# Patient Record
Sex: Male | Born: 1975 | Race: Black or African American | Hispanic: No | Marital: Single | Smoking: Current every day smoker
Health system: Southern US, Community
[De-identification: ages and names within clinical notes are randomized; demographics above are authoritative.]

## PROBLEM LIST (undated history)

## (undated) HISTORY — PX: ANKLE SURGERY: SHX546

---

## 2012-07-10 ENCOUNTER — Encounter (HOSPITAL_COMMUNITY): Payer: Self-pay | Admitting: Emergency Medicine

## 2012-07-10 ENCOUNTER — Emergency Department (HOSPITAL_COMMUNITY)
Admission: EM | Admit: 2012-07-10 | Discharge: 2012-07-10 | Disposition: A | Discharge status: Home / Self Care | Payer: Medicare Other | Attending: Emergency Medicine | Admitting: Emergency Medicine

## 2012-07-10 DIAGNOSIS — Z79899 Other long term (current) drug therapy: Secondary | ICD-10-CM | POA: Insufficient documentation

## 2012-07-10 DIAGNOSIS — Z9889 Other specified postprocedural states: Secondary | ICD-10-CM | POA: Insufficient documentation

## 2012-07-10 DIAGNOSIS — M25579 Pain in unspecified ankle and joints of unspecified foot: Secondary | ICD-10-CM | POA: Insufficient documentation

## 2012-07-10 DIAGNOSIS — F17200 Nicotine dependence, unspecified, uncomplicated: Secondary | ICD-10-CM | POA: Insufficient documentation

## 2012-07-10 DIAGNOSIS — Z760 Encounter for issue of repeat prescription: Secondary | ICD-10-CM | POA: Insufficient documentation

## 2012-07-10 DIAGNOSIS — R1110 Vomiting, unspecified: Secondary | ICD-10-CM | POA: Insufficient documentation

## 2012-07-10 DIAGNOSIS — Z8781 Personal history of (healed) traumatic fracture: Secondary | ICD-10-CM | POA: Insufficient documentation

## 2012-07-10 MED ORDER — OXYCODONE-ACETAMINOPHEN 5-325 MG PO TABS: 1.0000 | ORAL_TABLET | Freq: Four times a day (QID) | ORAL | Status: DC | PRN

## 2012-07-10 MED ORDER — ONDANSETRON 4 MG PO TBDP
8.0000 mg | ORAL_TABLET | Freq: Once | ORAL | Status: AC
  Administered 2012-07-10: 8 mg via ORAL
  Filled 2012-07-10: qty 2

## 2012-07-10 MED ORDER — OXYCODONE-ACETAMINOPHEN 5-325 MG PO TABS
2.0000 | ORAL_TABLET | Freq: Once | ORAL | Status: AC
  Administered 2012-07-10: 2 via ORAL
  Filled 2012-07-10: qty 2

## 2012-07-10 NOTE — ED Notes (Signed)
Pt sts out of pain meds and here with withdrawal sx; pt sticking finger down throat in room to make himself throw up; pt sts had 2 broken ankles

## 2012-07-10 NOTE — ED Provider Notes (Signed)
Medical screening examination/treatment/procedure(s) were performed by non-physician practitioner and as supervising physician I was immediately available for consultation/collaboration.   Glynn Octave, MD 07/10/12 1436

## 2012-07-10 NOTE — ED Provider Notes (Signed)
History     CSN: [id number]  Arrival date & time 07/10/12  1119   First MD Initiated Contact with Patient 07/10/12 1145      Chief Complaint  Patient presents with  . Ankle Pain  . Medication Refill  . Emesis    (Consider location/radiation/quality/duration/timing/severity/associated sxs/prior treatment) HPI Comments: 37 y/o male presents to the ED requesting a refill on his oxycodone and oxycontin for his bilateral broken ankles that were operated on back in February in [place]. States he has an appointment with his doctor on May 5 for reevaluation. He is in town for a family emergency and states he lost his pills about 3 days ago. Denies any new pain to his ankles. Feels as if he is going through withdrawal and has been vomiting a few times today. According to triage summary patient was sticking his finger down his throat in the room to make himself vomit.  Patient is a 37 y.o. male presenting with ankle pain and vomiting. The history is provided by the patient.  Ankle Pain Emesis   History reviewed. No pertinent past medical history.  History reviewed. No pertinent past surgical history.  History reviewed. No pertinent family history.  History  Substance Use Topics  . Smoking status: Current Every Day Smoker  . Smokeless tobacco: Not on file  . Alcohol Use: No      Review of Systems  Musculoskeletal:       Bilateral ankle pain.  All other systems reviewed and are negative.    Allergies  Review of patient's allergies indicates no known allergies.  Home Medications   Current Outpatient Rx  Name  Route  Sig  Dispense  Refill  . docusate sodium (COLACE) 100 MG capsule   Oral   Take 100 mg by mouth 2 (two) times daily as needed for constipation.         . enoxaparin (LOVENOX) 40 MG/0.4ML injection   Subcutaneous   Inject 40 mg into the skin daily.         Marland Kitchen oxyCODONE (ROXICODONE) 15 MG immediate release tablet   Oral   Take 15-30 mg by mouth  every 6 (six) hours as needed for pain.         . OxyCODONE HCl ER (OXYCONTIN) 30 MG T12A   Oral   Take 30 mg by mouth 2 (two) times daily.           BP 150/95  Pulse 76  Temp(Src) 97.4 F (36.3 C)  Resp 20  SpO2 99%  Physical Exam  Nursing note and vitals reviewed. Constitutional: He is oriented to person, place, and time. He appears well-developed and well-nourished. No distress.  HENT:  Head: Normocephalic and atraumatic.  Mouth/Throat: Oropharynx is clear and moist.  Eyes: Conjunctivae and EOM are normal. Pupils are equal, round, and reactive to light.  Neck: Normal range of motion. Neck supple.  Cardiovascular: Normal rate, regular rhythm, normal heart sounds and intact distal pulses.   Pulmonary/Chest: Effort normal and breath sounds normal. No respiratory distress.  Musculoskeletal:  4 inch surgical scar on bilateral anterior ankles. No swelling or deformity. Intact distal pulses. Generalized tenderness to palpation of bilateral ankles, ROM decreased limited by pain.  Neurological: He is alert and oriented to person, place, and time. No sensory deficit.  Skin: Skin is warm and dry.  Psychiatric: He has a normal mood and affect. His behavior is normal.    ED Course  Procedures (including critical care time)  Labs  Reviewed - No data to display No results found.   1. Medication refill       MDM  37 y/o male requesting medication refill for oxycodone and oxycontin. I discussed with him that the ER is not the place for medication refills, and he needs to call his doctor. States he did and they did no answer the phone. I will give an RX for percocet with 10 pills. No changes to ankles per pt. No signs of infection- no redness, swelling, warmth. Stable for discharge.       Trevor Mace, PA-C 07/10/12 1225

## 2012-10-26 ENCOUNTER — Encounter (HOSPITAL_COMMUNITY): Payer: Self-pay | Admitting: *Deleted

## 2012-10-26 DIAGNOSIS — R5381 Other malaise: Secondary | ICD-10-CM | POA: Insufficient documentation

## 2012-10-26 DIAGNOSIS — R6883 Chills (without fever): Secondary | ICD-10-CM | POA: Insufficient documentation

## 2012-10-26 DIAGNOSIS — Z79899 Other long term (current) drug therapy: Secondary | ICD-10-CM | POA: Insufficient documentation

## 2012-10-26 DIAGNOSIS — F17200 Nicotine dependence, unspecified, uncomplicated: Secondary | ICD-10-CM | POA: Insufficient documentation

## 2012-10-26 DIAGNOSIS — R5383 Other fatigue: Secondary | ICD-10-CM | POA: Insufficient documentation

## 2012-10-26 DIAGNOSIS — R112 Nausea with vomiting, unspecified: Secondary | ICD-10-CM | POA: Insufficient documentation

## 2012-10-26 NOTE — ED Notes (Signed)
The pt thinks he is withdrawing from oxycontin.  abd pain nv since Wednesday.  No meds for 2 weeks

## 2012-10-27 ENCOUNTER — Emergency Department (HOSPITAL_COMMUNITY)
Admission: EM | Admit: 2012-10-27 | Discharge: 2012-10-27 | Disposition: A | Discharge status: Home / Self Care | Payer: Medicare Other | Attending: Emergency Medicine | Admitting: Emergency Medicine

## 2012-10-27 DIAGNOSIS — R112 Nausea with vomiting, unspecified: Secondary | ICD-10-CM

## 2012-10-27 LAB — COMPREHENSIVE METABOLIC PANEL
Albumin: 4.5 g/dL (ref 3.5–5.2)
Alkaline Phosphatase: 84 U/L (ref 39–117)
BUN: 26 mg/dL — ABNORMAL HIGH (ref 6–23)
Calcium: 9.1 mg/dL (ref 8.4–10.5)
Creatinine, Ser: 0.8 mg/dL (ref 0.50–1.35)
Potassium: 3.6 mEq/L (ref 3.5–5.1)
Total Protein: 9 g/dL — ABNORMAL HIGH (ref 6.0–8.3)

## 2012-10-27 LAB — CBC WITH DIFFERENTIAL/PLATELET
Basophils Relative: 0 % (ref 0–1)
Eosinophils Absolute: 0 10*3/uL (ref 0.0–0.7)
Hemoglobin: 15.9 g/dL (ref 13.0–17.0)
MCH: 29.2 pg (ref 26.0–34.0)
MCHC: 34.9 g/dL (ref 30.0–36.0)
Monocytes Relative: 6 % (ref 3–12)
Neutrophils Relative %: 59 % (ref 43–77)

## 2012-10-27 LAB — LIPASE, BLOOD: Lipase: 15 U/L (ref 11–59)

## 2012-10-27 MED ORDER — DICYCLOMINE HCL 20 MG PO TABS: 20.0000 mg | ORAL_TABLET | Freq: Four times a day (QID) | ORAL | Status: DC | PRN

## 2012-10-27 MED ORDER — ONDANSETRON 4 MG PO TBDP: 4.0000 mg | ORAL_TABLET | Freq: Four times a day (QID) | ORAL | Status: DC | PRN

## 2012-10-27 MED ORDER — CLONIDINE HCL 0.1 MG PO TABS
0.1000 mg | ORAL_TABLET | Freq: Four times a day (QID) | ORAL | Status: DC
  Administered 2012-10-27: 0.1 mg via ORAL
  Filled 2012-10-27: qty 1

## 2012-10-27 MED ORDER — ONDANSETRON HCL 4 MG/2ML IJ SOLN
4.0000 mg | Freq: Once | INTRAMUSCULAR | Status: AC
  Administered 2012-10-27: 4 mg via INTRAVENOUS
  Filled 2012-10-27: qty 2

## 2012-10-27 MED ORDER — METHOCARBAMOL 500 MG PO TABS: 500.0000 mg | ORAL_TABLET | Freq: Three times a day (TID) | ORAL | Status: DC | PRN

## 2012-10-27 MED ORDER — LOPERAMIDE HCL 2 MG PO CAPS: 2.0000 mg | ORAL_CAPSULE | ORAL | Status: DC | PRN

## 2012-10-27 MED ORDER — NAPROXEN 250 MG PO TABS: 500.0000 mg | ORAL_TABLET | Freq: Two times a day (BID) | ORAL | Status: DC | PRN

## 2012-10-27 MED ORDER — ONDANSETRON HCL 4 MG PO TABS: 4.0000 mg | ORAL_TABLET | Freq: Four times a day (QID) | ORAL | Status: DC

## 2012-10-27 MED ORDER — CLONIDINE HCL 0.1 MG PO TABS: 0.1000 mg | ORAL_TABLET | ORAL | Status: DC

## 2012-10-27 MED ORDER — CLONIDINE HCL 0.1 MG PO TABS: 0.1000 mg | ORAL_TABLET | Freq: Every day | ORAL | Status: DC

## 2012-10-27 MED ORDER — SODIUM CHLORIDE 0.9 % IV BOLUS (SEPSIS)
1000.0000 mL | Freq: Once | INTRAVENOUS | Status: AC
  Administered 2012-10-27: 1000 mL via INTRAVENOUS

## 2012-10-27 MED ORDER — HYDROXYZINE HCL 25 MG PO TABS: 25.0000 mg | ORAL_TABLET | Freq: Four times a day (QID) | ORAL | Status: DC | PRN

## 2012-10-27 NOTE — ED Provider Notes (Signed)
CSN: [id number]     Arrival date & time 10/26/12  2328 History     First MD Initiated Contact with Patient 10/27/12 0104     Chief Complaint  Patient presents with  . Abdominal Pain   (Consider location/radiation/quality/duration/timing/severity/associated sxs/prior Treatment) HPI Comments: Has a four-day history of nausea vomiting abdominal pain. He thinks he is withdrawing from OxyContin and oxycodone. He stopped taking them 2 weeks ago for his chronic ankle pain.  He took suboxone last week. He states he is unable to keep anything down. He denies any diarrhea. He denies any sick contacts. He denies any fever, recent travel or antibiotic use.   The history is provided by the patient.    History reviewed. No pertinent past medical history. History reviewed. No pertinent past surgical history. No family history on file. History  Substance Use Topics  . Smoking status: Current Every Day Smoker  . Smokeless tobacco: Not on file  . Alcohol Use: No    Review of Systems  Constitutional: Positive for chills, activity change and appetite change. Negative for fever.  HENT: Negative for congestion and rhinorrhea.   Respiratory: Negative for cough, chest tightness and shortness of breath.   Cardiovascular: Negative for chest pain.  Gastrointestinal: Positive for nausea, vomiting and abdominal pain. Negative for diarrhea.  Genitourinary: Negative for dysuria and hematuria.  Musculoskeletal: Negative for back pain.  Skin: Negative for rash.  Neurological: Positive for weakness. Negative for dizziness and headaches.  A complete 10 system review of systems was obtained and all systems are negative except as noted in the HPI and PMH.    Allergies  Review of patient's allergies indicates no known allergies.  Home Medications   Current Outpatient Rx  Name  Route  Sig  Dispense  Refill  . clonazePAM (KLONOPIN) 1 MG tablet   Oral   Take 1 mg by mouth 2 (two) times daily as needed for  anxiety.         . dimenhyDRINATE (DRAMAMINE) 50 MG tablet   Oral   Take 50 mg by mouth every 8 (eight) hours as needed (for nausea).         Marland Kitchen oxyCODONE (ROXICODONE) 15 MG immediate release tablet   Oral   Take 15-30 mg by mouth every 6 (six) hours as needed for pain.         . OxyCODONE HCl ER (OXYCONTIN) 30 MG T12A   Oral   Take 30 mg by mouth 2 (two) times daily.         Marland Kitchen oxyCODONE-acetaminophen (PERCOCET) 5-325 MG per tablet   Oral   Take 1-2 tablets by mouth every 6 (six) hours as needed for pain.   10 tablet   0   . ondansetron (ZOFRAN) 4 MG tablet   Oral   Take 1 tablet (4 mg total) by mouth every 6 (six) hours.   12 tablet   0    BP 122/91  Pulse 77  Temp(Src) 98.5 F (36.9 C) (Oral)  Resp 17  SpO2 99% Physical Exam  Constitutional: He is oriented to person, place, and time. He appears well-developed and well-nourished. No distress.  HENT:  Head: Normocephalic and atraumatic.  Mouth/Throat: Oropharynx is clear and moist. No oropharyngeal exudate.  Eyes: Conjunctivae and EOM are normal. Pupils are equal, round, and reactive to light.  Neck: Normal range of motion. Neck supple.  Cardiovascular: Normal rate, regular rhythm and normal heart sounds.   No murmur heard. Pulmonary/Chest: Effort normal and breath  sounds normal. No respiratory distress. He exhibits no tenderness.  Abdominal: Soft. There is tenderness. There is no rebound and no guarding.  Diffuse tenderness without peritoneal signs.  No pain at McBurney's point.   Musculoskeletal: Normal range of motion. He exhibits no edema and no tenderness.  Neurological: He is alert and oriented to person, place, and time. No cranial nerve deficit. He exhibits normal muscle tone. Coordination normal.  Skin: Skin is warm.    ED Course   Procedures (including critical care time)  Labs Reviewed  CBC WITH DIFFERENTIAL - Abnormal; Notable for the following:    WBC 11.4 (*)    All other components  within normal limits  COMPREHENSIVE METABOLIC PANEL - Abnormal; Notable for the following:    Chloride 93 (*)    Glucose, Bld 124 (*)    BUN 26 (*)    Total Protein 9.0 (*)    All other components within normal limits  LIPASE, BLOOD  URINALYSIS, ROUTINE W REFLEX MICROSCOPIC  URINE RAPID DRUG SCREEN (HOSP PERFORMED)   No results found. 1. Nausea and vomiting     MDM  4 day history of abdominal pain and nausea and vomiting. Concern that he is withdrawing from narcotics. Patient is in no distress. His abdomen is soft without peritoneal signs no pain at McBurney's point.  Mild leukocytosis. Labs otherwise unremarkable. Patient feels improved after IV fluids antiemetics. He's had no vomiting in the ED. His abdominal pain has resolved. It is soft on reevaluation without peritoneal signs.  He is tolerating PO and requesting discharge.  Return precautions discussed with worsening pain or vomiting. Spoke with Patient that he could have early appendicitis though this is considered less likely. He agrees he will return if he develops worsening pain or nausea and vomiting.  Glynn Octave, MD 10/27/12 [phone]

## 2013-04-11 ENCOUNTER — Emergency Department (HOSPITAL_COMMUNITY)
Admission: EM | Admit: 2013-04-11 | Discharge: 2013-04-11 | Disposition: A | Discharge status: Home / Self Care | Payer: Medicare Other | Attending: Emergency Medicine | Admitting: Emergency Medicine

## 2013-04-11 ENCOUNTER — Encounter (HOSPITAL_COMMUNITY): Payer: Self-pay | Admitting: Emergency Medicine

## 2013-04-11 ENCOUNTER — Emergency Department (HOSPITAL_COMMUNITY): Payer: Medicare Other

## 2013-04-11 DIAGNOSIS — G8921 Chronic pain due to trauma: Secondary | ICD-10-CM | POA: Insufficient documentation

## 2013-04-11 DIAGNOSIS — Z87828 Personal history of other (healed) physical injury and trauma: Secondary | ICD-10-CM | POA: Insufficient documentation

## 2013-04-11 DIAGNOSIS — M25571 Pain in right ankle and joints of right foot: Secondary | ICD-10-CM

## 2013-04-11 DIAGNOSIS — M25572 Pain in left ankle and joints of left foot: Secondary | ICD-10-CM

## 2013-04-11 DIAGNOSIS — Z8781 Personal history of (healed) traumatic fracture: Secondary | ICD-10-CM | POA: Insufficient documentation

## 2013-04-11 DIAGNOSIS — M25579 Pain in unspecified ankle and joints of unspecified foot: Secondary | ICD-10-CM | POA: Insufficient documentation

## 2013-04-11 DIAGNOSIS — F17200 Nicotine dependence, unspecified, uncomplicated: Secondary | ICD-10-CM | POA: Insufficient documentation

## 2013-04-11 MED ORDER — NAPROXEN 500 MG PO TABS: 500.0000 mg | ORAL_TABLET | Freq: Two times a day (BID) | ORAL | Status: DC

## 2013-04-11 MED ORDER — OXYCODONE-ACETAMINOPHEN 10-325 MG PO TABS: 1.0000 | ORAL_TABLET | ORAL | Status: DC | PRN

## 2013-04-11 MED ORDER — HYDROMORPHONE HCL PF 1 MG/ML IJ SOLN
1.0000 mg | Freq: Once | INTRAMUSCULAR | Status: AC
  Administered 2013-04-11: 1 mg via INTRAMUSCULAR
  Filled 2013-04-11: qty 1

## 2013-04-11 NOTE — ED Provider Notes (Signed)
CSN: [id number]     Arrival date & time 04/11/13  4098 History  This chart was scribed for Austin Crumble, PA working with Austin Cisco, MD, by Austin Mcclure ED Scribe. This patient was seen in room WTR8/WTR8 and the patient's care was started at 7:25PM.     Chief Complaint  Patient presents with  . Ankle Pain    The history is provided by the patient. No language interpreter was used.   HPI Comments: Austin Mcclure is a 38 y.o. male who presents to the Emergency Department complaining of chronic, "throbbing, stabbing" bilateral ankle pain that is worsened when walking. Pt states that he had a injury in 2/14 from a 162ft  Fall off of a scaffold. He reports having a surgery to place "4 rods, 3 plates and 119 screws" in his ankles. He states he was in a wheelchair until 1 month ago. He also reports that he was prescribed Percocet 15mg  from his PCP in [place], however he ran out of this over 1 week ago. Pt states that the Percocet did not assist with pain relief. Pt states that his prescription was Last filled mid December 2014. He also states that he has been taking Aleve over the past week without relief of pain.   No past medical history on file. No past surgical history on file. No family history on file. History  Substance Use Topics  . Smoking status: Current Every Day Smoker  . Smokeless tobacco: Not on file  . Alcohol Use: No    Review of Systems  Musculoskeletal: Positive for arthralgias (bilateral ankles).  All other systems reviewed and are negative.   Allergies  Review of patient's allergies indicates no known allergies.  Home Medications   Current Outpatient Rx  Name  Route  Sig  Dispense  Refill  . docusate sodium (COLACE) 100 MG capsule   Oral   Take 100 mg by mouth daily as needed for mild constipation.         Marland Kitchen oxyCODONE (ROXICODONE) 15 MG immediate release tablet   Oral   Take 15-30 mg by mouth every 6 (six) hours as needed for pain.           Triage Vitals: BP 131/77  Pulse 111  Temp(Src) 98.4 F (36.9 C) (Oral)  Resp 16  SpO2 98%   Physical Exam  Nursing note and vitals reviewed. Constitutional: He is oriented to person, place, and time. He appears well-developed and well-nourished. No distress.  HENT:  Head: Normocephalic and atraumatic.  Eyes: EOM are normal.  Neck: Neck supple. No tracheal deviation present.  Cardiovascular: Normal rate.   Pulmonary/Chest: Effort normal. No respiratory distress.  Musculoskeletal: Normal range of motion.  Swelling of the bilateral ankle joints, right greater than left. There is no erythema around the joint, there is no warmth to palpation. Tenderness to anterior ankle joint and to the distal tib-fib area. Pain with any range of motion of the ankle joint. Feet and toes are normal. Reason normal. Patient has an atrophy of his calf muscles. Dorsal pedal pulses intact bilaterally.  Neurological: He is alert and oriented to person, place, and time.  Skin: Skin is warm and dry.  Psychiatric: He has a normal mood and affect. His behavior is normal.    ED Course  Procedures (including critical care time)   DIAGNOSTIC STUDIES: Oxygen Saturation is 98% on room air, normal by my interpretation.    COORDINATION OF CARE: 7:34 PM- Will order xray's on ankles  bilaterally. Will also order Dilaudid. Pt advised of plan for treatment and pt agrees.  Medications  HYDROmorphone (DILAUDID) injection 1 mg (not administered)   Labs Review Labs Reviewed - No data to display Imaging Review Dg Ankle Complete Left  04/11/2013   CLINICAL DATA:  Previous left ankle fracture, operative fixation, pain  EXAM: LEFT ANKLE COMPLETE - 3+ VIEW  COMPARISON:  None available  FINDINGS: Left distal tibia plate and screw fixation has been performed with healed deformity of the distal tibia. Normal alignment. No acute finding or fracture. No effusion. Secondary degenerative changes of the left ankle joint.   IMPRESSION: Status post left distal tibia ORIF for a remote fracture. No acute osseous finding by plain radiography   Electronically Signed   By: Austin Favorsrevor  Mcclure M.D.   On: 04/11/2013 19:51   Dg Ankle Complete Right  04/11/2013   CLINICAL DATA:  Previous ankle fractures with operative fixation, ankle pain  EXAM: RIGHT ANKLE - COMPLETE 3+ VIEW  COMPARISON:  None available  FINDINGS: Open reduction internal fixation with plate and screws has been performed of the right distal tibia with healed deformity. Secondary degenerative change of the right ankle joint. No acute fracture demonstrated. Right distal fibula, talus and calcaneus appear intact.  IMPRESSION: Previous ORIF of the right distal tibia with healed deformity and right ankle secondary osteoarthritis.  No acute osseous finding by plain radiography   Electronically Signed   By: Austin Favorsrevor  Mcclure M.D.   On: 04/11/2013 19:49    EKG Interpretation   None       MDM  No diagnosis found.  Patient's with bilateral ankle fractures after falling from an unknown height. His number changes every time. [place] ankles [place] Patient is now residing in this area. He's having difficulty finding a primary care Dr. her pain management specialist. He's working and getting his records transferred here at this time. He states he has been out of his pain medications for a week however states he did take his uncles and Norco which did not help. I did x-ray his bilateral ankles to make sure there is no problems with his repair, we do not have any prior imaging for him and assist him. His x-rays did not show any acute problems. Patient is able to ambulate at this time with a cane. He has no signs of infection of bilateral ankles. I will prescribe patient 20 tablets of oxycodone 10 mg. He is instructed to continue to try to find a primary care Dr.  Ceasar MonsFiled Vitals:   04/11/13 1910  BP: 131/77  Pulse: 111  Temp: 98.4 F (36.9 C)   TempSrc: Oral  Resp: 16  SpO2: 98%   I personally performed the services described in this documentation, which was scribed in my presence. The recorded information has been reviewed and is accurate.   Austin Musselatyana A Nayla Dias, PA-C 04/11/13 2036

## 2013-04-11 NOTE — ED Notes (Signed)
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 Pt is out of pain meds.

## 2013-04-11 NOTE — Discharge Instructions (Signed)
Keep your feet elevated at home. Ice. Take pain medications as prescribed. Follow up with orthopedics or primary care doctor here in [place].     Emergency Department Resource Guide 1) Find a Doctor and Pay Out of Pocket Although you won't have to find out who is covered by your insurance plan, it is a good idea to ask around and get recommendations. You will then need to call the office and see if the doctor you have chosen will accept you as a new patient and what types of options they offer for patients who are self-pay. Some doctors offer discounts or will set up payment plans for their patients who do not have insurance, but you will need to ask so you aren't surprised when you get to your appointment.  2) Contact Your Local Health Department Not all health departments have doctors that can see patients for sick visits, but many do, so it is worth a call to see if yours does. If you don't know where your local health department is, you can check in your phone book. The CDC also has a tool to help you locate your state's health department, and many state websites also have listings of all of their local health departments.  3) Find a Walk-in Clinic If your illness is not likely to be very severe or complicated, you may want to try a walk in clinic. These are popping up all over the country in pharmacies, drugstores, and shopping centers. They're usually staffed by nurse practitioners or physician assistants that have been trained to treat common illnesses and complaints. They're usually fairly quick and inexpensive. However, if you have serious medical issues or chronic medical problems, these are probably not your best option.  No Primary Care Doctor: - Call Health Connect at  [phone] - they can help you locate a primary care doctor that  accepts your insurance, provides certain services, etc. - Physician Referral Service- [phone]  Chronic Pain Problems: Organization          Address  Phone   Notes  [facility]  [phone]) [phone] Patients need to be referred by their primary care doctor.   Medication Assistance: Organization         Address  Phone   Notes  [facility] [address] [place], [place] [address] [phone] --Must be a resident of [place] -- Must have NO insurance coverage whatsoever (no Medicaid/ Medicare, etc.) -- The pt. MUST have a primary care doctor that directs their care regularly and follows them in the community   MedAssist  [phone]   [employer]  [phone]    Agencies that provide inexpensive medical care: Organization         Address  Phone   Notes  [facility]  [phone]   [facility]    [phone]   [facility] [address] [place], [place] [address] [phone]   [facility] [address], [place] [phone]) [phone]   [facility]    [phone]   [facility]    [phone]   [facility]  [address], [place] Phone:  [phone]) [phone], Fax:  [phone] Hours of Operation:  9 am - 6 pm, M-F.  Also accepts Medicaid/Medicare and self-pay.  [facility]  [address], [place]  Phone: [phone], Fax: [phone]. Hours of Operation:  8:30 am - 5:30 pm, M-F.  Also accepts Medicaid and self-pay.  °[facility] [place] [address], [place] Phone: ([phone]) [phone]   °[facility] [address], [place], [place] ([phone], Ext. 123 Mondays & Thursdays: 7-9 AM.  First 15 patients are seen on a first come, first serve basis. °  ° °Medicaid-accepting [place] Providers: ° °Organization         Address  Phone   Notes  °[facility] [address], [place] [phone] Also accepts self-pay patients.  °[facility] [address], [place] ° ([phone]) [phone]   °[facility] [address], [place] ([phone]) [phone]   °[facility] [address] [place] [address], [place] ([phone]) [phone]   °Veita Bland [address], [place]  ° [phone]) [phone] Only accepts [place] Access Medicaid patients after they have their name applied to their card.  ° °Self-Pay (no insurance) in [place]: ° °Organization         Address  Phone   Notes  °Sickle Cell Patients, [facility] [address], [place] [phone]) [phone]   °[facility] [address], [place] ([phone]) [phone]   °[facility] ° [address], [place] [phone]) [phone]   °[facility]/Dr. Osei-Bonsu ° [address] [place] [address], [place] or [address], [place] [phone] Phone number for both [place] and [place] locations is the same.  °[facility] [address], [place] ([phone]) [phone]   °[facility] [address] [place] [address], [place] or [address] ([phone]) [phone] °([phone]) [phone]   °[facility] [address], [place] ([phone]) [phone], phone; [phone], fax Sees patients 1st and 3rd Saturday of every month.  Must not qualify for public or private insurance (i.e. Medicaid, Medicare, [place] Health Choice, Veterans' Benefits) • Household income should be no more than 200% of the poverty level •The clinic cannot treat you if you are pregnant or think you are pregnant • Sexually transmitted diseases are not treated at the clinic.  ° ° °Dental Care: °Organization         Address  Phone  Notes  °[place] [facility] [address], [place] ([phone]) [phone] Accepts children up to age 21 who are enrolled in Medicaid or [place] Health Choice; pregnant women with a Medicaid card; and  children who have applied for Medicaid or [place] Health Choice, but were declined, whose parents can pay a reduced fee at time of service.  °[place] [facility] [place]  [address], [place] ([phone]) [phone] Accepts children up to age 21 who are enrolled in Medicaid or [place] Health Choice; pregnant women with a Medicaid card; and children who have applied for Medicaid or  Health Choice, but were declined, whose parents can pay a reduced fee at time of service.  °[facility] ° [address],  ([phone]) [phone] Patients are seen by appointment only. Walk-ins are not accepted. [facility] will see patients 18 years of age and older. °Monday - Tuesday (8am-5pm) °Most Wednesdays (8:30-5pm) °$30 per visit, cash only  °[facility] ° [address], [facility] [phone] Patients are seen by appointment only. Walk-ins are not accepted. [place] will see patients 23 years of age and older. One Wednesday Evening (Monthly: Volunteer Based).  $30 per visit, cash only  [place]  [phone] for adults; Children under age 68, call Graduate Pediatric Dentistry at [phone]. Children aged 23-14, please call [phone] to request a pediatric application.  Dental services are provided in all areas of dental care including fillings, crowns and bridges, complete and partial dentures, implants, gum treatment, root canals, and extractions. Preventive care is also provided. Treatment is provided to both adults and children. Patients are selected via a lottery and there is often a waiting list.   [facility] [address] [place]  [phone] [email or web address]   [facility] [address] [place], [place] [phone], Ext. [phone] Second and Fourth Thursday of each month, opens at 6:30 AM; Clinic ends at 9 AM.  Patients are seen on a first-come first-served  basis, and a limited number are seen during each clinic.   [facility]  [address] Hillard Danker [place], [place] [phone]   Eligibility Requirements You must have lived in [place], [place], or [place] counties for at least the last three months.   You cannot be eligible for state or federal sponsored [employer], including [employer], [place], or [employer].   You generally cannot be eligible for healthcare insurance through your employer.    How to apply: Eligibility screenings are held every Tuesday and Wednesday afternoon from 1:00 pm until 4:00 pm. You do not need an appointment for the interview!  [facility] [address], [place], [place]   [place]  [place]  [phone]    Behavioral Health Resources in the Community: Intensive Outpatient Programs Organization         Address  Phone  Notes  [facility] [address], [place], [place] [phone]   [facility] [address] [place], [place]   [facility] [address] [place], [place]   [place] [address]  [place], [place] or [phone]   Substance Abuse Resources Organization         Address  Phone  Notes  [facility]  [phone]) [phone]   [place]  ([phone]) [phone]   [facility] [place]   Chinita Pester  [phone]   Residential & Outpatient Substance Abuse Program  [phone])[phone]   Psychological Services Organization         Address  Phone  Notes  [facility] [place]  [place]  (604) [phone]   [place] [address], [place] or [phone]    Mobile Crisis Teams Organization          Address  Phone  Notes  [facility], Mobile Crisis Care Unit  [phone]   [facility]  [address] [place] [employer], [place]   [place] [address] [place] [phone]    Self-Help/Support Groups Organization         Address  Phone             Notes  [place] [facility] [place] - variety of  support groups  [phone]- [phone] Call for more information  °Narcotics Anonymous (NA), [facility] [address], °[place]  2 meetings at this location  ° °Residential Treatment Programs °Organization         Address  Phone  Notes  °[facility] [address],    °[place]  [phone]   °[facility] ° [address], [place], [place] [phone]   °[facility] [address], [place] [phone] Admissions: 8am-3pm M-F  °[facility] [address]    °[place], [place] [phone]   °[facility] [address], [place], [place] [phone]   °[facility] [address]  °[place], [place] [phone]   °[facility] - Intensive Outpatient [address], [place], [place] [phone]   °[facility] [address]  °[place], [place] 1-877-615-2722 or [phone]   °[facility] ([facility]) [address] [place], [place] [phone] Accepts Medicaid  °[facility] [address]  °[place] 1-800-659-3381 Substance Abuse/Addiction Treatment  ° °[place] Behavioral Health Resources °Organization         Address  Phone  Notes  °[facility]  (888) 581-9988   °Julie Brannon, PhD [address] [place], [place]   (336) [phone] or (336) [phone]   °[facility]   [address] °[place], [place] (336) [phone]   °[facility] [address], [place], [place] (336) [phone] Insurance/Medicaid/sponsorship  through [facility]  °[facility] [address]                                    [place], [place] (336) [phone] Therapy/tele-psych/case  °[facility] [address]  ° [place], [place] (336) [phone]    °Dr. Arfeen  (336) [phone]   °[facility] [place]  [employer] [place] [facility] 1) [address], [place] °2) [address], [place] °3)  [address], [place] (336) [phone] °(336) [phone] ° °(336) [phone]   °[place] Child Abuse Hotline (336) 342-1394 or (336) 342-3537 (After Hours)    ° ° ° °

## 2013-04-11 NOTE — ED Provider Notes (Signed)
Medical screening examination/treatment/procedure(s) were performed by non-physician practitioner and as supervising physician I was immediately available for consultation/collaboration.  Shanna CiscoMegan E Docherty, MD 04/11/13 2206

## 2014-01-05 ENCOUNTER — Emergency Department (HOSPITAL_COMMUNITY): Payer: Medicare Other

## 2014-01-05 ENCOUNTER — Encounter (HOSPITAL_COMMUNITY): Payer: Self-pay | Admitting: Emergency Medicine

## 2014-01-05 DIAGNOSIS — W108XXA Fall (on) (from) other stairs and steps, initial encounter: Secondary | ICD-10-CM | POA: Insufficient documentation

## 2014-01-05 DIAGNOSIS — Z720 Tobacco use: Secondary | ICD-10-CM | POA: Diagnosis not present

## 2014-01-05 DIAGNOSIS — S99911A Unspecified injury of right ankle, initial encounter: Secondary | ICD-10-CM | POA: Insufficient documentation

## 2014-01-05 DIAGNOSIS — Y9289 Other specified places as the place of occurrence of the external cause: Secondary | ICD-10-CM | POA: Insufficient documentation

## 2014-01-05 DIAGNOSIS — Z9889 Other specified postprocedural states: Secondary | ICD-10-CM | POA: Insufficient documentation

## 2014-01-05 DIAGNOSIS — S3992XA Unspecified injury of lower back, initial encounter: Secondary | ICD-10-CM | POA: Diagnosis present

## 2014-01-05 DIAGNOSIS — S39012A Strain of muscle, fascia and tendon of lower back, initial encounter: Secondary | ICD-10-CM | POA: Insufficient documentation

## 2014-01-05 DIAGNOSIS — Y9302 Activity, running: Secondary | ICD-10-CM | POA: Insufficient documentation

## 2014-01-05 NOTE — ED Notes (Signed)
Pt. slipped and fell from stairs this evening , no LOC / alert and oriented , reports pain at right ankle and low back pain .

## 2014-01-06 ENCOUNTER — Emergency Department (HOSPITAL_COMMUNITY)
Admission: EM | Admit: 2014-01-06 | Discharge: 2014-01-06 | Disposition: A | Discharge status: Home / Self Care | Payer: Medicare Other | Attending: Emergency Medicine | Admitting: Emergency Medicine

## 2014-01-06 DIAGNOSIS — M25571 Pain in right ankle and joints of right foot: Secondary | ICD-10-CM

## 2014-01-06 DIAGNOSIS — S39012A Strain of muscle, fascia and tendon of lower back, initial encounter: Secondary | ICD-10-CM

## 2014-01-06 DIAGNOSIS — W19XXXA Unspecified fall, initial encounter: Secondary | ICD-10-CM

## 2014-01-06 DIAGNOSIS — R52 Pain, unspecified: Secondary | ICD-10-CM

## 2014-01-06 MED ORDER — ACETAMINOPHEN 325 MG PO TABS
650.0000 mg | ORAL_TABLET | Freq: Once | ORAL | Status: AC
  Administered 2014-01-06: 650 mg via ORAL
  Filled 2014-01-06: qty 2

## 2014-01-06 MED ORDER — HYDROCODONE-ACETAMINOPHEN 5-325 MG PO TABS
1.0000 | ORAL_TABLET | Freq: Once | ORAL | Status: AC
  Administered 2014-01-06: 1 via ORAL
  Filled 2014-01-06: qty 1

## 2014-01-06 MED ORDER — IBUPROFEN 200 MG PO TABS
600.0000 mg | ORAL_TABLET | Freq: Once | ORAL | Status: AC
  Administered 2014-01-06: 600 mg via ORAL
  Filled 2014-01-06: qty 3

## 2014-01-06 NOTE — ED Notes (Signed)
Pt upset with this RN regarding no pain RX at discharge, pt states, "man, you gotta give me some vicodin or something!" EDP notified. No new orders.

## 2014-01-06 NOTE — Discharge Instructions (Signed)
If you were given medicines take as directed.  If you are on coumadin or contraceptives realize their levels and effectiveness is altered by many different medicines.  If you have any reaction (rash, tongues swelling, other) to the medicines stop taking and see a physician.   Please follow up as directed and return to the ER or see a physician for new or worsening symptoms.  Thank you. Filed Vitals:   01/05/14 2232 01/06/14 0123 01/06/14 0128  BP: 116/77 117/68   Pulse: 77 64   Temp: 97.6 F (36.4 C) 98 F (36.7 C)   TempSrc: Oral Oral   Resp: 16 14   SpO2: 100% 97% 98%

## 2014-01-06 NOTE — ED Provider Notes (Signed)
CSN: [id number]     Arrival date & time 01/05/14  2225 History   First MD Initiated Contact with Patient 01/06/14 0133     Chief Complaint  Patient presents with  . Fall     (Consider location/radiation/quality/duration/timing/severity/associated sxs/prior Treatment) HPI Comments: 38 year old male with history of right ankle surgery, smoker presents with lower back pain and right ankle pain since prior to arrival. Patient trying to avoid his running child caused him to fall down the stairs as evening. No head injury or loss of consciousness. No blood thinners. Pain with palpation range of motion of back and right ankle. Patient denies neuro symptoms.  Patient is a 38 y.o. male presenting with fall. The history is provided by the patient.  Fall Pertinent negatives include no chest pain, no abdominal pain, no headaches and no shortness of breath.    History reviewed. No pertinent past medical history. Past Surgical History  Procedure Laterality Date  . Ankle surgery     No family history on file. History  Substance Use Topics  . Smoking status: Current Every Day Smoker  . Smokeless tobacco: Not on file  . Alcohol Use: No    Review of Systems  Respiratory: Negative for shortness of breath.   Cardiovascular: Negative for chest pain.  Gastrointestinal: Negative for vomiting and abdominal pain.  Genitourinary: Negative for dysuria and flank pain.  Musculoskeletal: Positive for arthralgias and joint swelling. Negative for back pain, neck pain and neck stiffness.  Skin: Negative for rash.  Neurological: Negative for light-headedness and headaches.      Allergies  Review of patient's allergies indicates no known allergies.  Home Medications   Prior to Admission medications   Not on File   BP 107/66  Pulse 61  Temp(Src) 97.7 F (36.5 C) (Oral)  Resp 20  SpO2 99% Physical Exam  Nursing note and vitals reviewed. Constitutional: He is oriented to person, place, and time.  He appears well-developed and well-nourished.  HENT:  Head: Normocephalic and atraumatic.  Eyes: Conjunctivae are normal. Right eye exhibits no discharge. Left eye exhibits no discharge.  Neck: Normal range of motion. Neck supple. No tracheal deviation present.  Cardiovascular: Normal rate and regular rhythm.   Pulmonary/Chest: Effort normal and breath sounds normal.  Abdominal: Soft. He exhibits no distension. There is no tenderness. There is no guarding.  Musculoskeletal: He exhibits edema and tenderness.  No midline lumbar, thoracic or cervical tenderness. Full range of motion head neck. Patient has paraspinal lower lumbar tenderness to palpation range of motion. Patient has anterior and medial right ankle tenderness with mild swelling, neurovascularly intact. No hip or knee pain bilateral.  Neurological: He is alert and oriented to person, place, and time. No sensory deficit.  Reflex Scores:      Patellar reflexes are 2+ on the right side and 2+ on the left side.      Achilles reflexes are 1+ on the right side and 1+ on the left side. Patient has 5+ strength with flexion and extension of hips knees and great toes bilateral lower extremities  Skin: Skin is warm. No rash noted.  Psychiatric: He has a normal mood and affect.    ED Course  Procedures (including critical care time) Labs Review Labs Reviewed - No data to display  Imaging Review Dg Lumbar Spine Complete  01/05/2014   CLINICAL DATA:  Fall with back pain.  Initial encounter  EXAM: LUMBAR SPINE - COMPLETE 4+ VIEW  COMPARISON:  None.  FINDINGS: No acute  fracture or malalignment. Mild, relative L4-5 and L5-S1 disc narrowing.  IMPRESSION: No acute osseous findings.   Electronically Signed   By: Tiburcio PeaJonathan  Watts M.D.   On: 01/05/2014 23:20   Dg Ankle Complete Right  01/05/2014   CLINICAL DATA:  Fall with ankle pain and swelling. Initial encounter  EXAM: RIGHT ANKLE - COMPLETE 3+ VIEW  COMPARISON:  04/11/2013  FINDINGS: Remote  distal tibia fracture status post ORIF using ventral lateral plate. The fractures are healed. A distal screw chronically overlaps the ankle joint, and could be partly intra-articular. There is no evidence of acute fracture or hardware displacement. No malalignment. Posttraumatic osteoarthritis of the ankle joint.  IMPRESSION: 1. No acute osseous findings. 2. Remote distal tibia fracture status post ORIF.   Electronically Signed   By: Tiburcio PeaJonathan  Watts M.D.   On: 01/05/2014 23:16     EKG Interpretation None      MDM   Final diagnoses:  Lumbar strain, initial encounter  Acute right ankle pain  Fall, initial encounter   Overall healthy male with musculoskeletal injuries. X-rays reviewed no acute fracture. Pain medicines given an outpatient follow up discussed.  Results and differential diagnosis were discussed with the patient/parent/guardian. Close follow up outpatient was discussed, comfortable with the plan.   Medications  acetaminophen (TYLENOL) tablet 650 mg (650 mg Oral Given 01/06/14 0215)  ibuprofen (ADVIL,MOTRIN) tablet 600 mg (600 mg Oral Given 01/06/14 0215)  HYDROcodone-acetaminophen (NORCO/VICODIN) 5-325 MG per tablet 1 tablet (1 tablet Oral Given 01/06/14 0215)    Filed Vitals:   01/06/14 0145 01/06/14 0200 01/06/14 0215 01/06/14 0234  BP: 118/68 133/68 116/67 107/66  Pulse: 60 80 63 61  Temp:    97.7 F (36.5 C)  TempSrc:    Oral  Resp: 17 18 17 20   SpO2: 99% 100% 99% 99%    Final diagnoses:  Lumbar strain, initial encounter  Acute right ankle pain  Fall, initial encounter       Enid SkeensJoshua M Nadia Torr, MD 01/06/14 [phone]

## 2016-06-12 IMAGING — CR DG LUMBAR SPINE COMPLETE 4+V
5 series · 5 of 5 positions shown · non-contrast
Comparison: None.

CLINICAL DATA: Fall with back pain.  Initial encounter

EXAM:
LUMBAR SPINE - COMPLETE 4+ VIEW

[t l-spine a.p.]
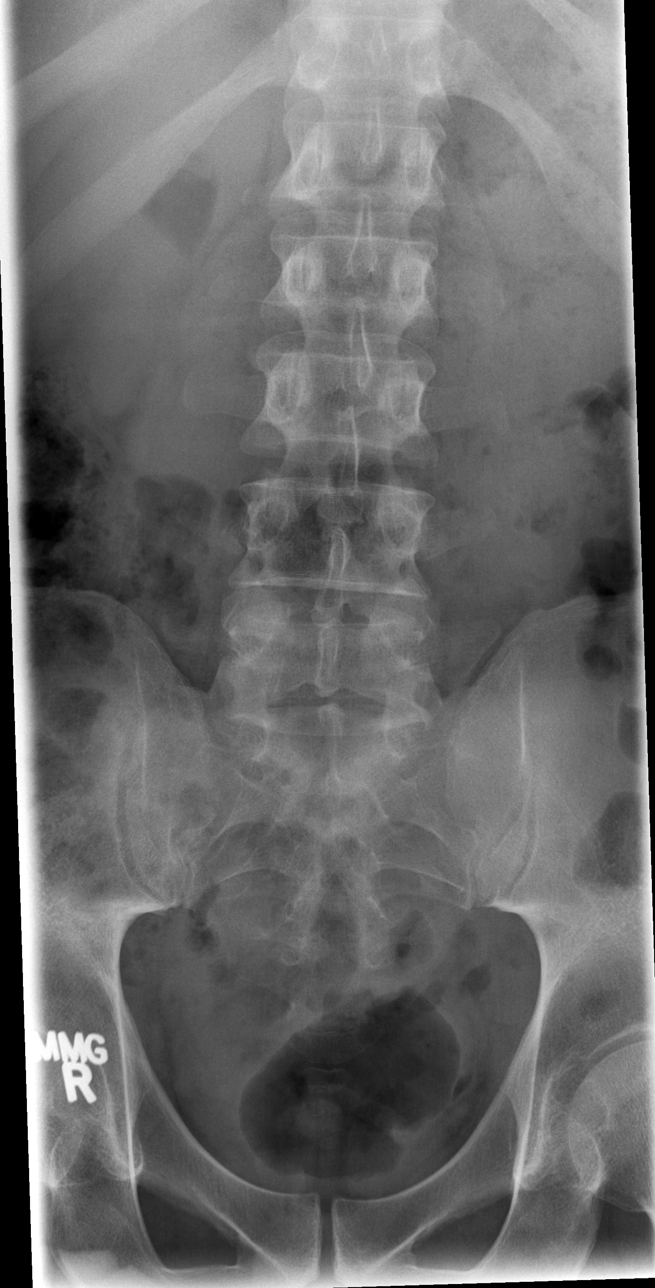

[t l-spine oblique exposure (1 of 2)]
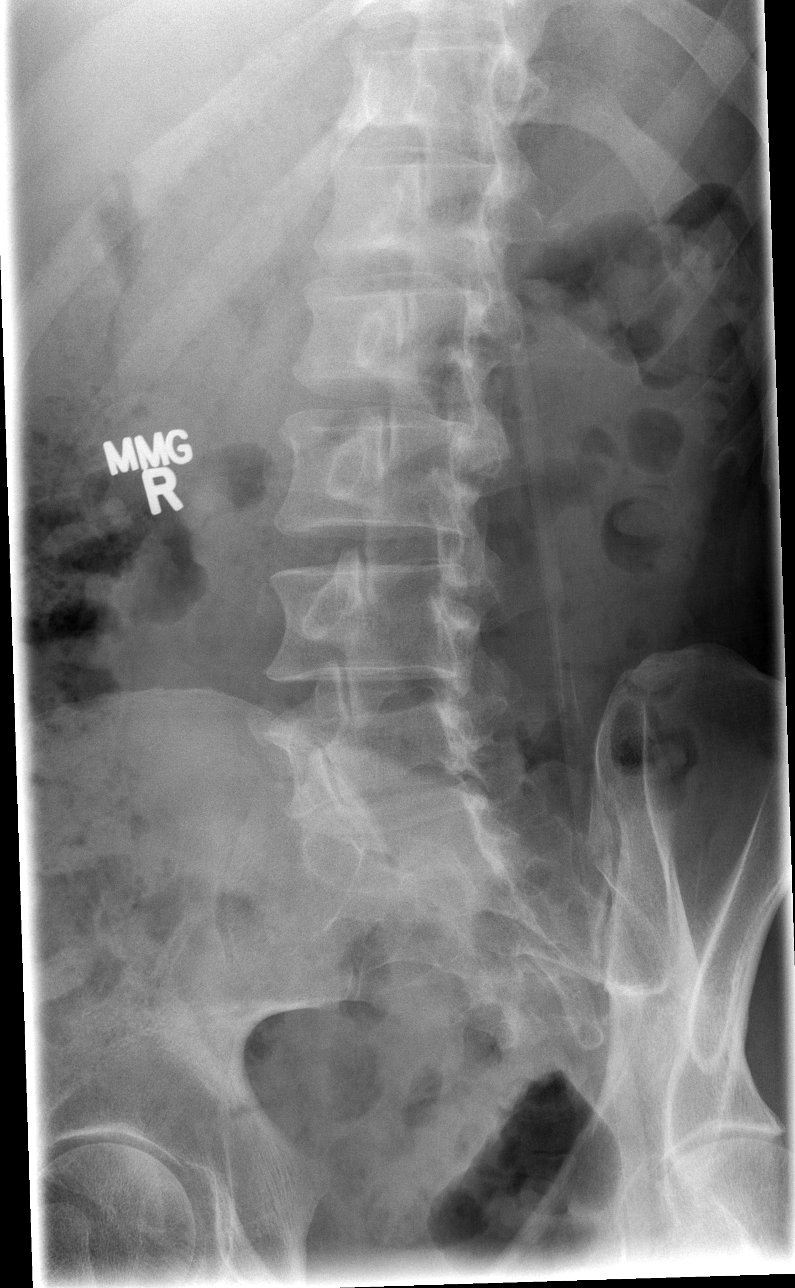

[t l-spine oblique exposure (2 of 2)]
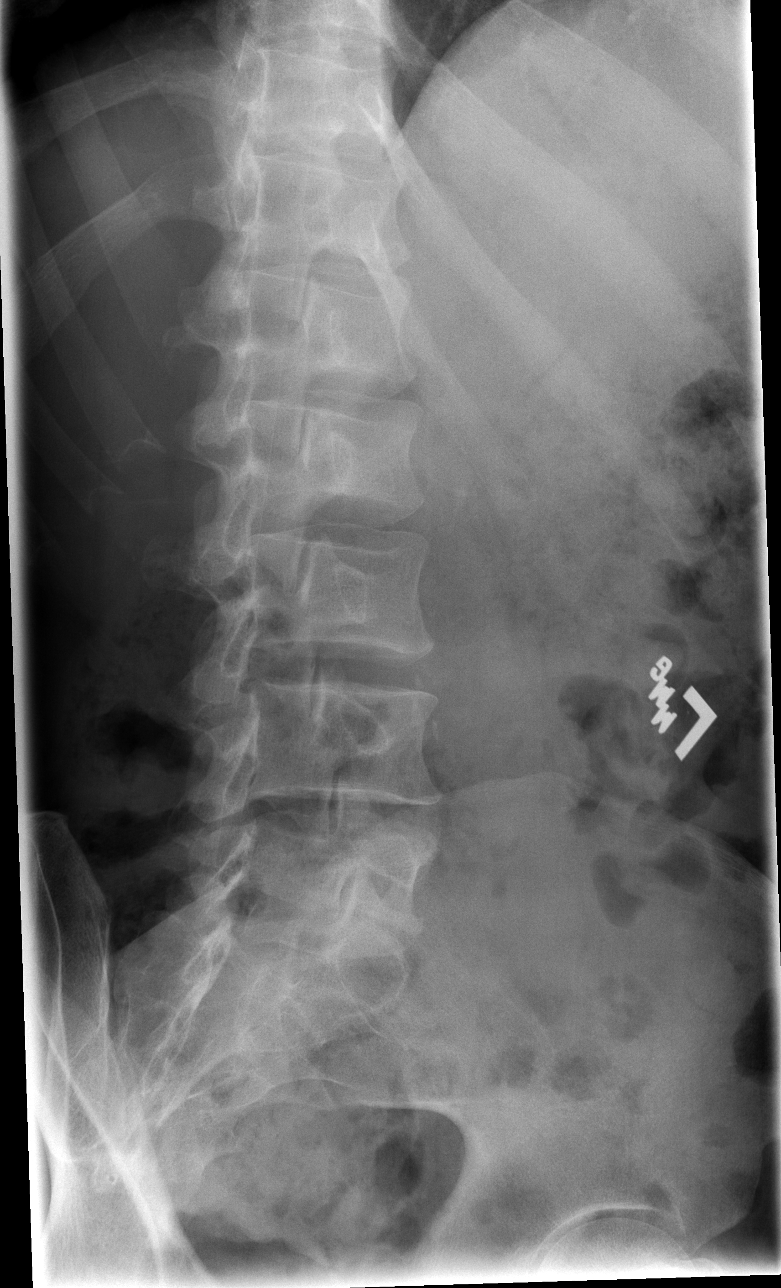

[t l-spine lat]
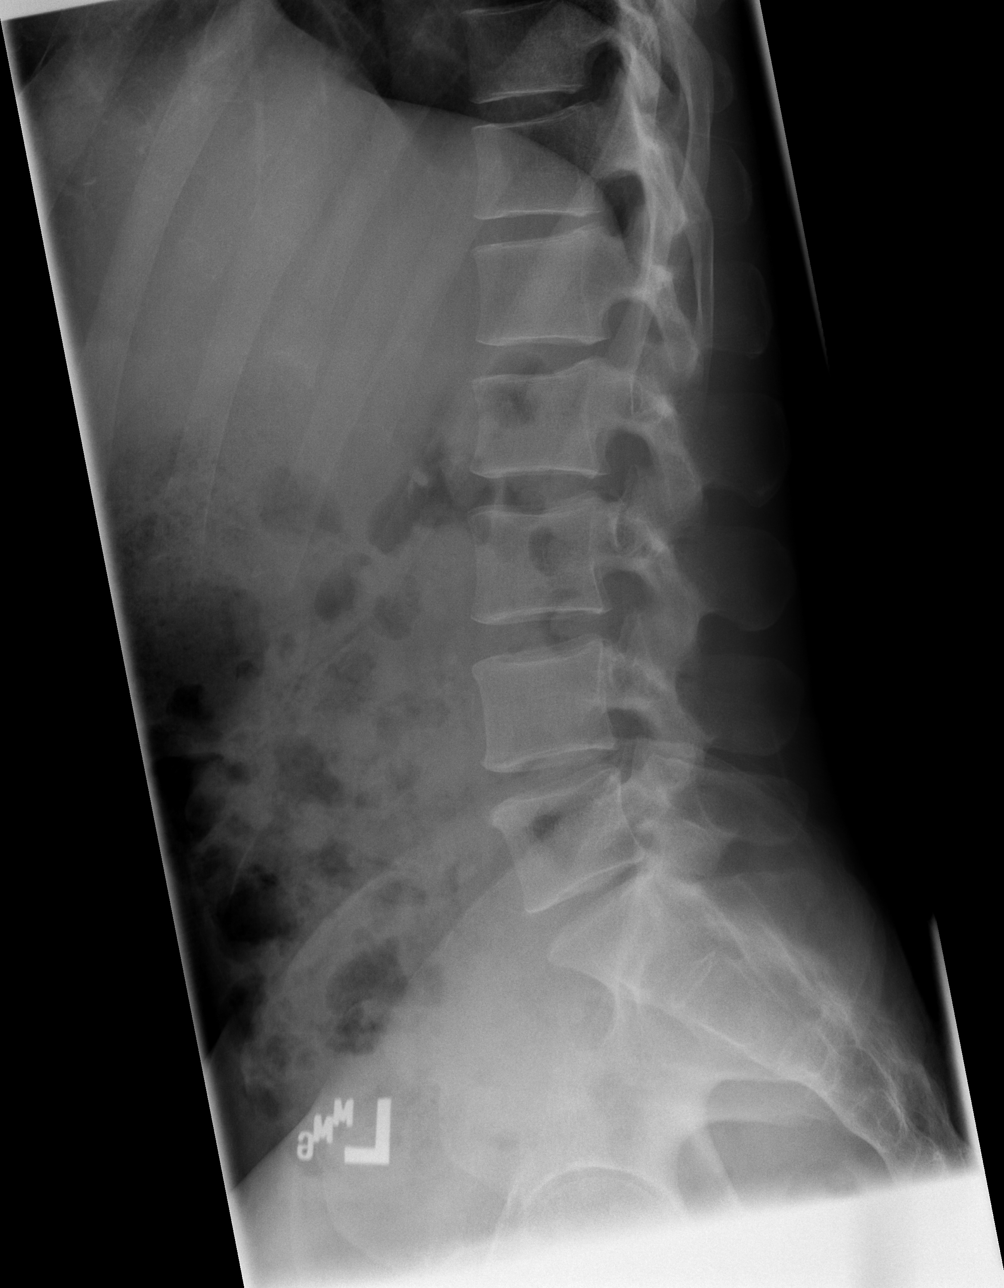

[t l-spine l5-s1 spot]
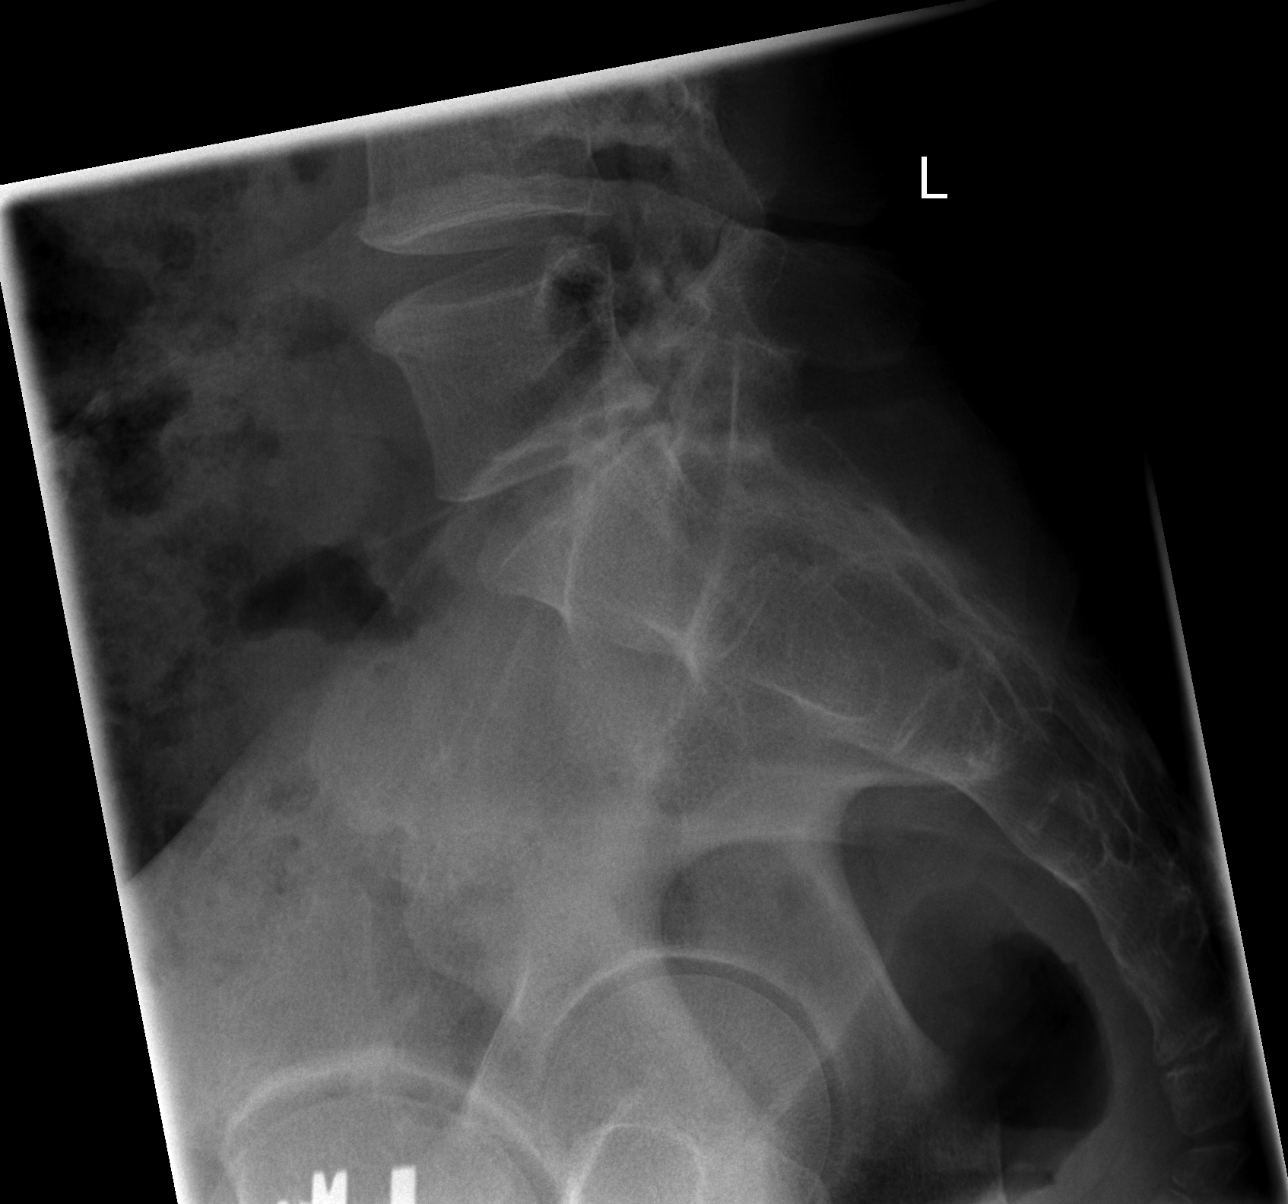

[5 of 5 positions shown; findings below may reference images not displayed]

FINDINGS: No acute fracture or malalignment. Mild, relative L4-5 and L5-S1
disc narrowing.
IMPRESSION: No acute osseous findings.

## 2017-02-22 ENCOUNTER — Inpatient Hospital Stay
Admit: 2017-02-22 | Discharge: 2017-02-22 | Disposition: A | Discharge status: Home / Self Care | Payer: MEDICARE | Attending: Emergency Medicine

## 2017-02-22 DIAGNOSIS — G8929 Other chronic pain: Secondary | ICD-10-CM

## 2017-02-22 MED ORDER — HYDROCODONE-ACETAMINOPHEN 5 MG-325 MG TAB
5-325 mg | ORAL | Status: AC
Start: 2017-02-22 — End: 2017-02-22
  Administered 2017-02-22: 20:00:00 via ORAL

## 2017-02-22 MED FILL — HYDROCODONE-ACETAMINOPHEN 5 MG-325 MG TAB: 5-325 mg | ORAL | Qty: 2

## 2017-02-22 NOTE — ED Notes (Signed)
Bedside shift change report given to semhaal (oncoming nurse) by marina (offgoing nurse). Report included the following information SBAR and ED Summary. Pt resting. States he "feels more calm than before".

## 2017-02-22 NOTE — ED Notes (Signed)
Pt resting quietly w/ NAD.

## 2017-02-22 NOTE — ED Notes (Signed)
Pt a&ox4 d/c instructions reviewed w/ pt. Additional food and juice to pt. MD spoke pt @ pt. Request.

## 2017-02-22 NOTE — ED Notes (Signed)
Pt sleeping soundly arouses to gentle touch. Admits to re-occurring bilateral ankle pain. Denies re-injury since original trauma 2014. Swelling present Rt. > Lt. Pt states swelling is new. And having difficulty ambulating. Was in pain management but " marijuana" was found in screening pt  Was removed from program .

## 2017-02-22 NOTE — ED Notes (Signed)
Pt continues to rest/ sleep repositions self. Sleeping w/ NAD

## 2017-02-22 NOTE — ED Notes (Signed)
Meal to pt pt encourage to remain awake for discharge.

## 2017-02-22 NOTE — ED Triage Notes (Signed)
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  Last use was yesterday afternoon at 1500.  Nodding off during triage interview.

## 2017-02-22 NOTE — ED Notes (Signed)
[facility].

## 2017-02-22 NOTE — ED Notes (Signed)
AUDIT Screen Score:  0      Document Brief Intervention (corresponds directly with the 5 A's, Ask, Advise, Assess, Assist, and Arrange):  41 y.o.  patient referred to SBIRT via in basket due to screening score of 10 for suspected/identified drug overdose or drug use. Patient will actively engage with SBIRT at this time evident by her report of wanting inpatient treatment. SBIRT will follow-up with patient and provide supportive/motivating reinforcements by encouraging reflection, discussing short term and long term health risks of using illicit drugs/consuming alcohol, identifying barriers of change and reaffirming willingness to help along with provide educational materials.         At- Risk Patients (Score 7-15 for women; 8-15 for men)  Discuss concern patient is drinking at unhealthy levels known to increase risk of alcohol-related health problems.    Is Patient ready to commit to change? Pt is  in the contemplation stage and wants to go inpatient. Pt thought hospital had a detox unit. [facility]    If No:  ? Encourage reflection  ? Discuss short term and long term health risks of consuming alcohol  ? Barriers to change  ? Reaffirm willingness to help / Educational materials provided  If Yes:  ? Set goal  ? Plan  ? Educational materials provided    Harmful use or Dependence (Score 16 or greater)  ? Discuss short term and long term health risks of consuming alcohol  ? Recommendations  ? Negotiate drinking goal  ? Recommend addiction specialist/center  ? Arrange follow-up appointments.

## 2017-02-22 NOTE — ED Provider Notes (Signed)
Frutoso ChaseCourtney Deboer is a 41 y.o. adult with h/o polysubstance abuse and bil ankle surgery in 2014 who presents to the ED with complaint of opiate intoxication. He states that he had surgery to place hardware in his ankles and was prescribed oxycodone at the time, and has had chronic pain since then. He started using marijuana for pain relief and then started using heroin. Last use was yesterday at 1500. He currently states that he is "ready to quit heroin" and that he wants to find a detox program. He currently denies any fever, HA, CP, SOB, abd pain, N/V.       The history is provided by the patient.   Other   Pertinent negatives include no chest pain, no abdominal pain, no headaches and no shortness of breath.   Drug Overdose   This is a new problem. The current episode started 6 to 12 hours ago. The problem occurs constantly. The problem has been gradually improving. Pertinent negatives include no chest pain, no abdominal pain, no headaches and no shortness of breath. Nothing aggravates the symptoms. Nothing relieves the symptoms.        History reviewed. No pertinent past medical history.    History reviewed. No pertinent surgical history.      History reviewed. No pertinent family history.    Social History     Socioeconomic History   ??? Marital status: SINGLE     Spouse name: Not on file   ??? Number of children: Not on file   ??? Years of education: Not on file   ??? Highest education level: Not on file   Social Needs   ??? Financial resource strain: Not on file   ??? Food insecurity - worry: Not on file   ??? Food insecurity - inability: Not on file   ??? Transportation needs - medical: Not on file   ??? Transportation needs - non-medical: Not on file   Occupational History   ??? Not on file   Tobacco Use   ??? Smoking status: Not on file   Substance and Sexual Activity   ??? Alcohol use: Not on file   ??? Drug use: Not on file   ??? Sexual activity: Not on file   Other Topics Concern   ??? Not on file   Social History Narrative    ??? Not on file         ALLERGIES: Patient has no known allergies.    Review of Systems   Constitutional: Negative.  Negative for chills and fever.   HENT: Negative.  Negative for congestion.    Eyes: Negative.  Negative for visual disturbance.   Respiratory: Negative.  Negative for cough and shortness of breath.    Cardiovascular: Negative.  Negative for chest pain.   Gastrointestinal: Negative.  Negative for abdominal pain, blood in stool, diarrhea, nausea and vomiting.   Genitourinary: Negative.  Negative for dysuria, frequency, hematuria and urgency.   Musculoskeletal: Negative.  Negative for joint swelling.   Skin: Negative.  Negative for rash.   Neurological: Negative.  Negative for dizziness, light-headedness and headaches.   All other systems reviewed and are negative.      Vitals:    02/22/17 0603   BP: (!) 163/93   Pulse: 91   Resp: 20   Temp: 97.6 ??F (36.4 ??C)   SpO2: 96%            Physical Exam   Constitutional: She is oriented to person, place, and time. She appears  well-developed and well-nourished.   HENT:   Head: Normocephalic and atraumatic.   Eyes: Conjunctivae are normal. Pupils are equal, round, and reactive to light.   Neck: Normal range of motion. Neck supple.   Cardiovascular: Normal rate, regular rhythm, normal heart sounds and intact distal pulses. Exam reveals no gallop.   No murmur heard.  Pulmonary/Chest: Effort normal and breath sounds normal. No respiratory distress. She has no wheezes. She has no rales. She exhibits no tenderness.   Abdominal: Soft. Bowel sounds are normal. She exhibits no distension. There is no tenderness. There is no rebound and no guarding.   Musculoskeletal: Normal range of motion. She exhibits no edema or tenderness.   Neurological: She is alert and oriented to person, place, and time.   Skin: Skin is warm and dry. No rash noted. No erythema.   Psychiatric: She has a normal mood and affect. Her behavior is normal. Judgment and thought content normal.    Nursing note and vitals reviewed.       MDM  Number of Diagnoses or Management Options  Drug abuse [facility]):   Diagnosis management comments: 41 y.o. adult with h/o chronic pain and opiate abuse presents with no somatic complaints and is seeking detox program.      Plan:   [facility]   -----  8:08 AM  Documented by Allen Derryaniela Q Lin, acting as a scribe for Dr. Harriett Sineran, Jaynia Fendley K, MD.    PROVIDER ATTESTATION:  2:16 PM    The entirety of this note, signed by me, accurately reflects all works, treatments, procedures, and medical decision making performed by me, Harriett SineQuincy K Jakayden Cancio, MD.           Patient Progress  Patient progress: stable       2:08 PM  I spoke with [facility] who states pt [facility].   Patient is ready for discharge  Procedures
